# Patient Record
Sex: Female | Born: 1980 | Race: White | Hispanic: No | Marital: Married | Smoking: Never smoker
Health system: Southern US, Community
[De-identification: ages and names within clinical notes are randomized; demographics above are authoritative.]

## PROBLEM LIST (undated history)

## (undated) HISTORY — PX: CHOLECYSTECTOMY: SHX55

---

## 2004-05-23 ENCOUNTER — Inpatient Hospital Stay: Payer: Self-pay

## 2007-07-14 ENCOUNTER — Ambulatory Visit: Payer: Self-pay | Admitting: Family Medicine

## 2008-07-31 ENCOUNTER — Emergency Department: Payer: Self-pay | Admitting: Emergency Medicine

## 2008-08-04 ENCOUNTER — Observation Stay: Payer: Self-pay | Admitting: General Surgery

## 2009-02-17 ENCOUNTER — Observation Stay: Payer: Self-pay | Admitting: Obstetrics and Gynecology

## 2009-04-12 ENCOUNTER — Inpatient Hospital Stay: Payer: Self-pay | Admitting: Obstetrics and Gynecology

## 2009-05-27 IMAGING — US ABDOMEN ULTRASOUND
1 series · 17 of 25 positions shown · non-contrast
Comparison: none

REASON FOR EXAM: ruq pain
COMMENTS:

[Series 1: abdomen ultrasound · 17 of 64 slices shown]
[im 1/64]
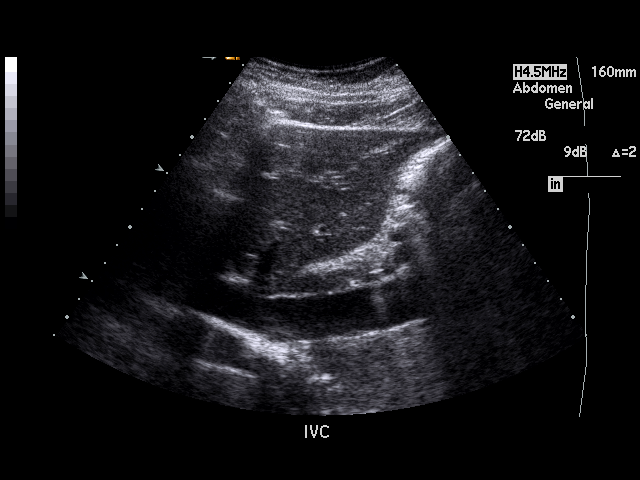
[im 6/64]
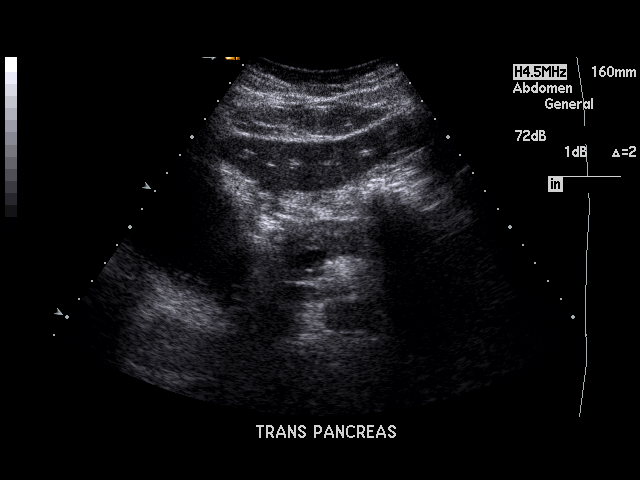
[im 8/64]
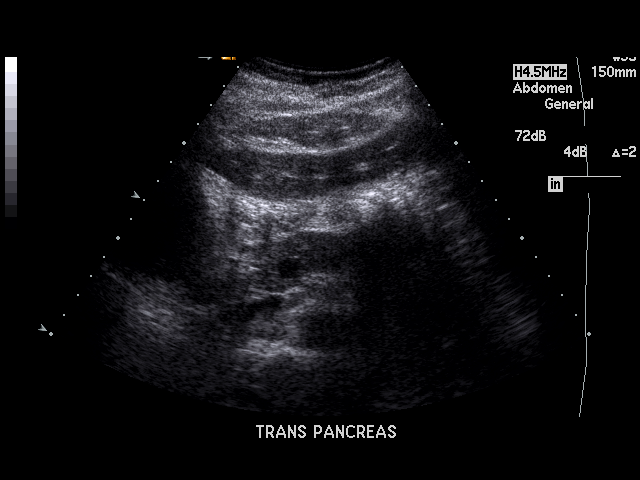
[im 14/64]
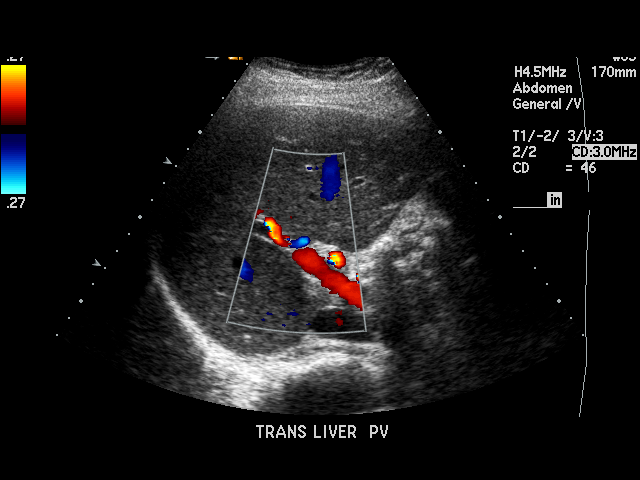
[im 16/64]
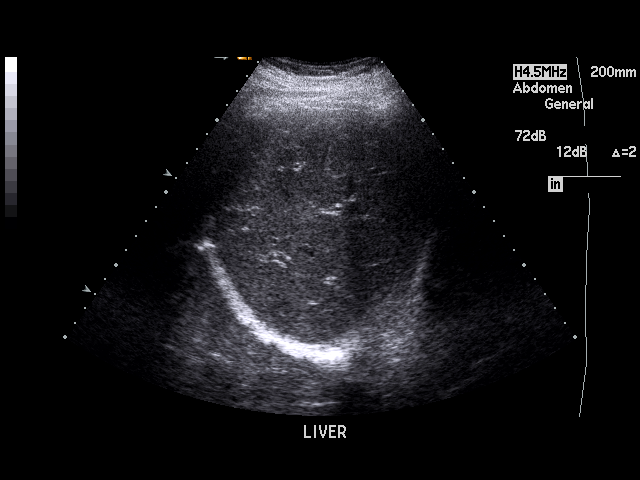
[im 22/64]
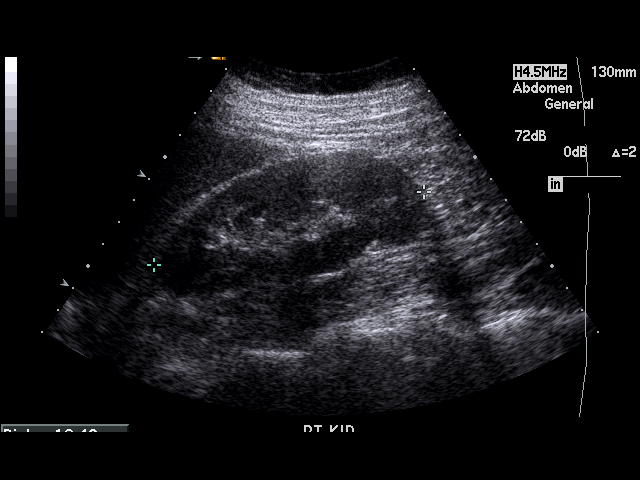
[im 24/64]
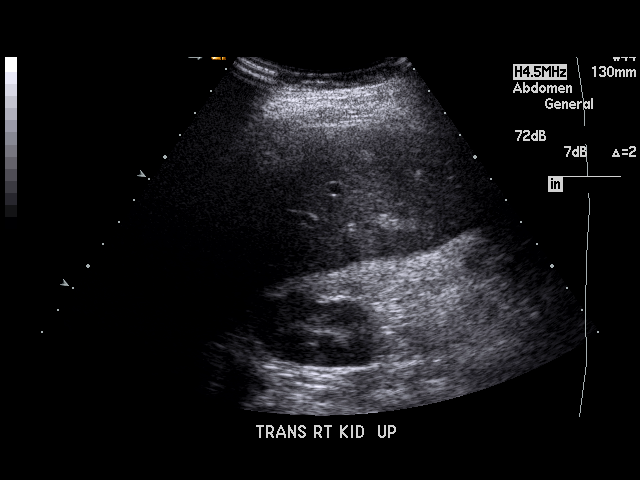
[im 29/64]
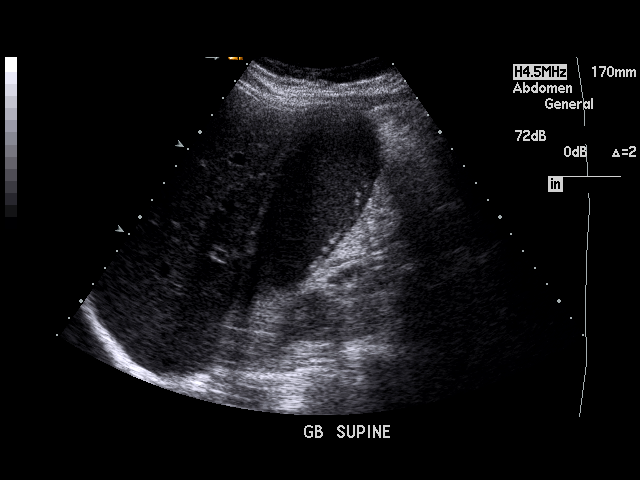
[im 32/64]
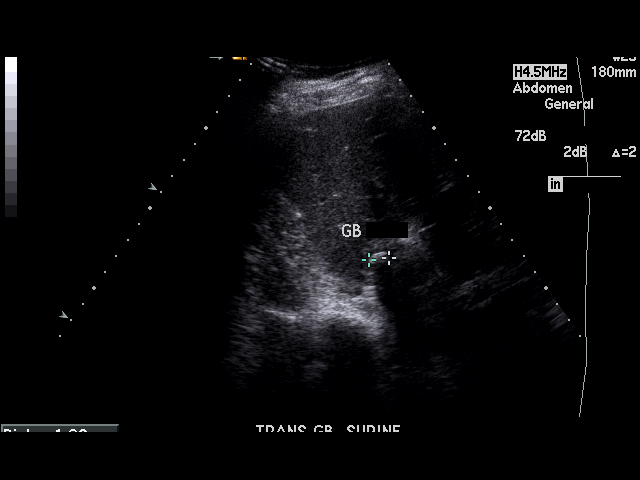
[im 35/64]
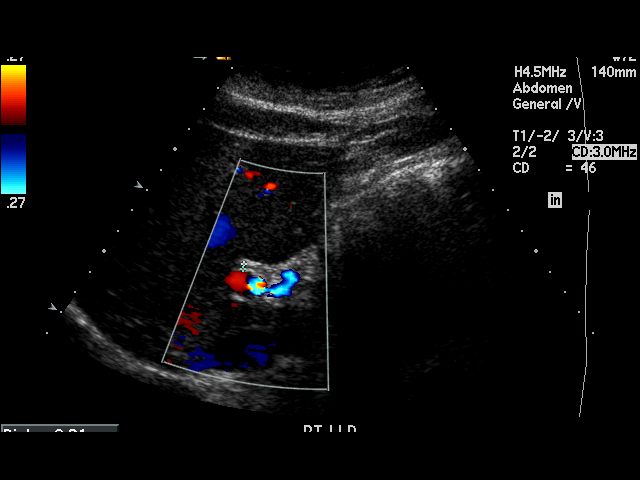
[im 40/64]
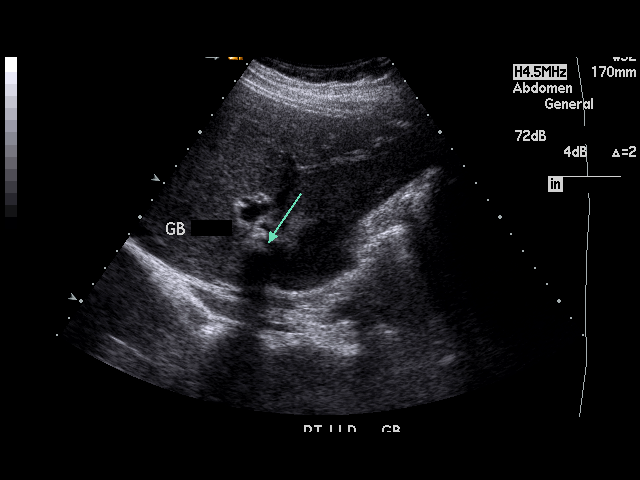
[im 43/64]
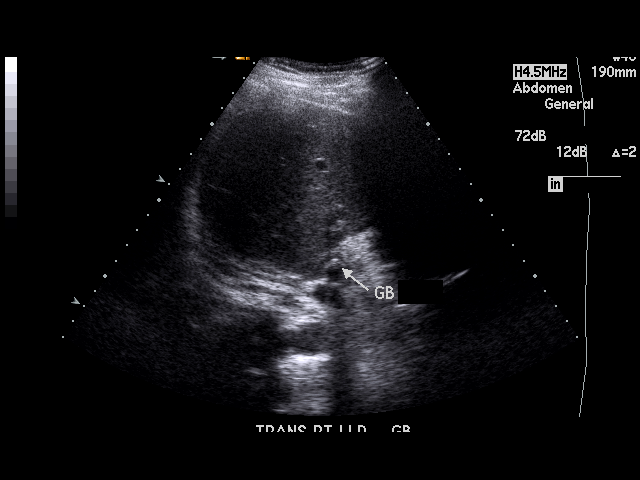
[im 48/64]
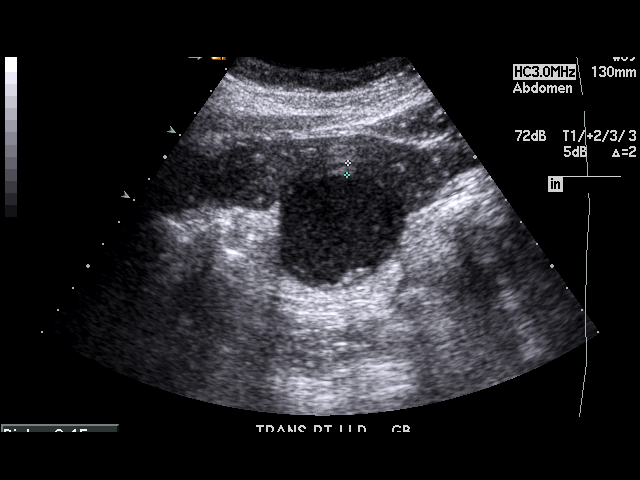
[im 50/64]
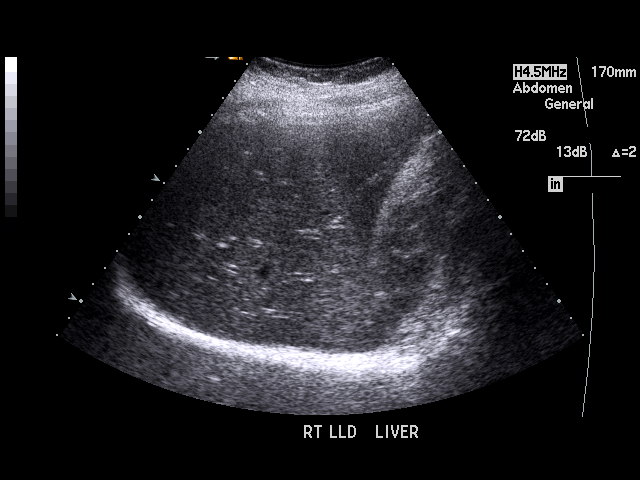
[im 56/64]
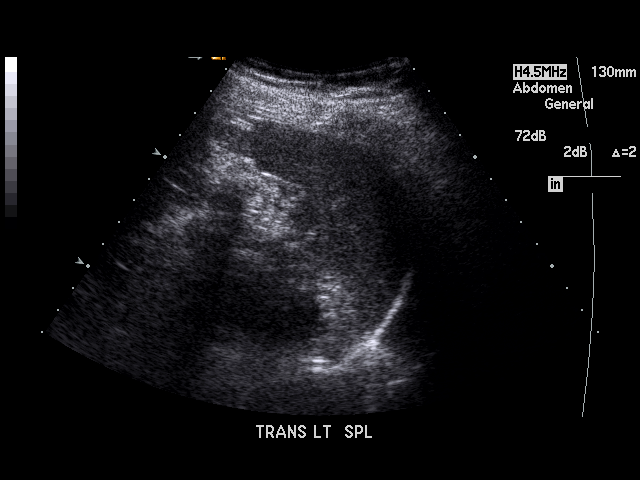
[im 58/64]
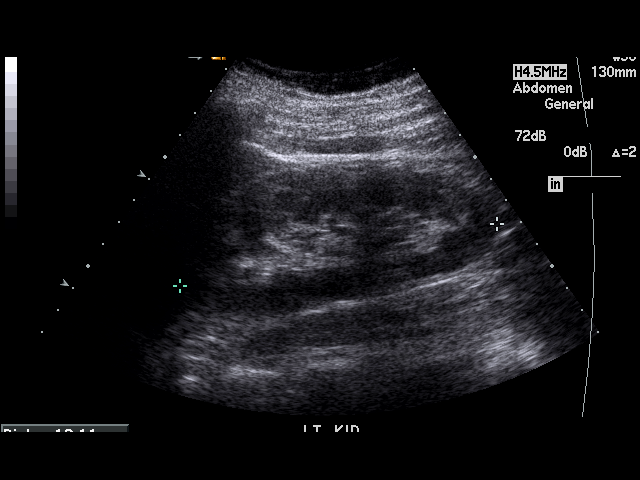
[im 64/64]
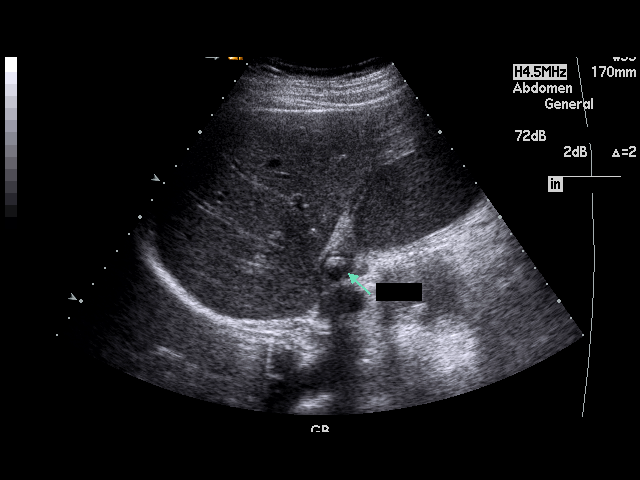

[17 of 25 positions shown; findings below may reference images not displayed]

PROCEDURE:     US  - US ABDOMEN GENERAL SURVEY  - August 04, 2008  [DATE]

RESULT:     The liver, spleen, pancreas, abdominal aorta and inferior vena
cava reveal no significant abnormalities. Note is made that the pancreatic
tail is not well seen due to adjacent bowel gas. There is sludge throughout
the gallbladder. There are multiple echodensities compatible with
gallstones. One of the stones remains in the gallbladder neck during the
course of the exam and may be lodged in the gallbladder neck. There is
thickening of the gallbladder wall which measures 4.5 mm at maximum
thickness. The gallbladder appears large and hydropic. The gallbladder
measures 11 cm x 5 cm. No pericholecystic fluid is seen. No ascites is
identified. The kidneys show no hydronephrosis. The common bile duct
measures 2.1 mm in diameter which is within normal limits.
IMPRESSION: 1.  There are gallstones and sludge within the gallbladder which appears
hydropic. There is mild thickening of the gallbladder wall suspicious for
cholecystitis.
2.  The common bile duct is normal in size.
3.  No ascites is seen.
4.  The pancreatic tail is partially obscured by bowel gas.

## 2014-05-20 ENCOUNTER — Ambulatory Visit: Payer: Self-pay | Admitting: Obstetrics and Gynecology

## 2014-07-10 NOTE — Op Note (Signed)
PATIENT NAME:  Brittany Garrett, Brittany Garrett MR#:  [id number] DATE OF BIRTH:  12/31/1980  DATE OF PROCEDURE:  05/20/2014  PREOPERATIVE DIAGNOSIS: Retained IUD.   POSTOPERATIVE DIAGNOSIS: Retained IUD.   SURGEON: Christeen DouglasBethany Suzan Manon, MD.   PROCEDURE: Hysteroscopy with IUD removal.   ANESTHESIA: Total IV anesthesia.   ESTIMATED BLOOD LOSS: Minimal. Intravenous fluids: 1 liter.   COMPLICATIONS: None.   FINDINGS: Normal size and shaped uterus, IUD in situ with strings attached and curled in the endometrial cavity.   SPECIMEN TYPE: None.   CONDITION: Stable to PACU.   INDICATION FOR PROCEDURE: Ms. Brittany Garrett is [name] with history of Mirena IUD placed 5 years ago. The IUD was attempted to be removed in the office in the standard way, as well as under ultrasound guidance; however, the strings were unable to be visualized and even under ultrasound guidance, the IUD remained in the endometrial cavity. For this reason, she was consented for Garrett hysteroscopic removal and was brought to the Operating Room for that procedure.   PROCEDURE: The patient arrived in the Operating Room, where she was identified by name and birthdate. She was placed on the operating table in supine position and IV anesthesia was given for good sedation. The patient was then prepped and draped in the usual sterile fashion and positioned in [name]. [name] was performed with all team members present and in agreement. The patient has been on doxycycline for the past 3 days after the attempted IUD removal in the office and will continue to take that postoperatively. [name] was performed with all team members present and in agreement.   Bivalve speculum was placed in the vagina and to visualize the cervix, which was grasped with [name]. Paracervical block was performed with 1% lidocaine for Garrett total of 3 mL injected. The uterus was sounded to 8 cm and  then cervix was serially dilated up with Hegar dilators. The hysteroscope was then introduced into the cervix and carefully guided to the uterine cavity. The IUD was visualized and the strings grasped with hysteroscopic graspers. The strings passed easily into the cervical canal and with very minimal traction, the IUD was removed into the vagina.   [name] was removed from the anterior lip of the cervix and hemostasis assured using silver nitrate and pressure. All instruments were removed from the vagina. [name] was taken to confirm removal. The IV sedation was reversed and the patient was taken awake to PACU. She tolerated these procedures well and is stable in recovery.   ____________________________ Cline CoolsBethany E. Keyon Winnick, MD beb:ap D: 05/20/2014 14:55:22 ET T: 05/20/2014 21:58:41 ET JOB#: [id number]  cc: Cline CoolsBethany E. Janalynn Eder, MD, <Dictator> Cline CoolsBETHANY E Rosalee Tolley MD ELECTRONICALLY SIGNED 06/14/2014 17:40

## 2015-01-07 ENCOUNTER — Encounter: Payer: Self-pay | Admitting: Emergency Medicine

## 2015-01-07 DIAGNOSIS — Z3A15 15 weeks gestation of pregnancy: Secondary | ICD-10-CM | POA: Diagnosis not present

## 2015-01-07 DIAGNOSIS — O23592 Infection of other part of genital tract in pregnancy, second trimester: Secondary | ICD-10-CM | POA: Diagnosis not present

## 2015-01-07 DIAGNOSIS — Z79899 Other long term (current) drug therapy: Secondary | ICD-10-CM | POA: Insufficient documentation

## 2015-01-07 DIAGNOSIS — O209 Hemorrhage in early pregnancy, unspecified: Secondary | ICD-10-CM | POA: Diagnosis present

## 2015-01-07 DIAGNOSIS — O200 Threatened abortion: Secondary | ICD-10-CM | POA: Diagnosis not present

## 2015-01-07 DIAGNOSIS — Z880 Allergy status to penicillin: Secondary | ICD-10-CM | POA: Diagnosis not present

## 2015-01-07 NOTE — ED Notes (Addendum)
Pt is [redacted] weeks pregnant, due 06/29/15; 5th pregnancy; 4 live births; no complications; says she was just at the movie theater, left to use the bathroom and noticed bright red bleeding; was having some discharge yesterday; [facility] and was told to monitor; now having cramping to left lower abd; denies urinary s/s

## 2015-01-08 ENCOUNTER — Emergency Department
Admission: EM | Admit: 2015-01-08 | Discharge: 2015-01-08 | Disposition: A | Discharge status: Home / Self Care | Payer: BC Managed Care – PPO | Attending: Emergency Medicine | Admitting: Emergency Medicine

## 2015-01-08 DIAGNOSIS — O200 Threatened abortion: Secondary | ICD-10-CM

## 2015-01-08 DIAGNOSIS — N760 Acute vaginitis: Secondary | ICD-10-CM

## 2015-01-08 DIAGNOSIS — B9689 Other specified bacterial agents as the cause of diseases classified elsewhere: Secondary | ICD-10-CM

## 2015-01-08 LAB — URINALYSIS COMPLETE WITH MICROSCOPIC (ARMC ONLY)
BACTERIA UA: NONE SEEN
Bilirubin Urine: NEGATIVE
GLUCOSE, UA: NEGATIVE mg/dL
Leukocytes, UA: NEGATIVE
Nitrite: NEGATIVE
PROTEIN: NEGATIVE mg/dL
SPECIFIC GRAVITY, URINE: 1.008 (ref 1.005–1.030)
pH: 6 (ref 5.0–8.0)

## 2015-01-08 LAB — CBC WITH DIFFERENTIAL/PLATELET
BASOS PCT: 0 %
Basophils Absolute: 0 10*3/uL (ref 0–0.1)
Eosinophils Absolute: 0.1 10*3/uL (ref 0–0.7)
Eosinophils Relative: 1 %
HEMATOCRIT: 38.4 % (ref 35.0–47.0)
HEMOGLOBIN: 12.5 g/dL (ref 12.0–16.0)
LYMPHS ABS: 2.6 10*3/uL (ref 1.0–3.6)
Lymphocytes Relative: 21 %
MCH: 28 pg (ref 26.0–34.0)
MCHC: 32.5 g/dL (ref 32.0–36.0)
MCV: 86.1 fL (ref 80.0–100.0)
MONO ABS: 0.8 10*3/uL (ref 0.2–0.9)
MONOS PCT: 7 %
NEUTROS ABS: 8.7 10*3/uL — AB (ref 1.4–6.5)
NEUTROS PCT: 71 %
Platelets: 258 10*3/uL (ref 150–440)
RBC: 4.46 MIL/uL (ref 3.80–5.20)
RDW: 13.4 % (ref 11.5–14.5)
WBC: 12.3 10*3/uL — ABNORMAL HIGH (ref 3.6–11.0)

## 2015-01-08 LAB — HCG, QUANTITATIVE, PREGNANCY: HCG, BETA CHAIN, QUANT, S: 57776 m[IU]/mL — AB (ref <5)

## 2015-01-08 LAB — WET PREP, GENITAL
TRICH WET PREP: NONE SEEN
YEAST WET PREP: NONE SEEN

## 2015-01-08 LAB — CHLAMYDIA/NGC RT PCR (ARMC ONLY)
Chlamydia Tr: NOT DETECTED
N gonorrhoeae: NOT DETECTED

## 2015-01-08 LAB — ABO/RH: ABO/RH(D): A POS

## 2015-01-08 MED ORDER — METRONIDAZOLE 500 MG PO TABS: 500.0000 mg | ORAL_TABLET | Freq: Two times a day (BID) | ORAL | Status: AC

## 2015-01-08 MED ORDER — METRONIDAZOLE 500 MG PO TABS
500.0000 mg | ORAL_TABLET | Freq: Once | ORAL | Status: AC
  Administered 2015-01-08: 500 mg via ORAL
  Filled 2015-01-08: qty 1

## 2015-01-08 NOTE — ED Notes (Signed)
MD at bedside for reeval

## 2015-01-08 NOTE — ED Notes (Signed)
Lab called and notified to add ABO/Rh and CBC to blood already in lab.

## 2015-01-08 NOTE — Discharge Instructions (Signed)

## 2015-01-08 NOTE — ED Notes (Signed)
MD at bedside for eval.

## 2015-01-08 NOTE — ED Provider Notes (Addendum)
[facility] Emergency Department Provider Note  ____________________________________________  Time seen: Approximately 2 AM  I have reviewed the triage vital signs and the nursing notes.   HISTORY  Chief Complaint Vaginal Bleeding    HPI Brittany Garrett is a 34 y.o. female who is a G5 P4 at 15 weeks at this time who is presenting with left lower quadrant abdominal pain as well as vaginal bleeding. She said she just finished the movie when she noticed blood on her underwear. It was several small spots of blood. When she wiped she did not have any blood. She said that she has not noticed any vaginal bleeding recently but did have some mucus-like discharge starting yesterday. She does not of any history of sexually transmitted diseases. She is followed up at the [facility] for her pregnancy. She is taking prenatal vitamins. She says that her left lower quadrant pain has been intermittent and chronic. She says it is mild at this time.   History reviewed. No pertinent past medical history.  There are no active problems to display for this patient.   Past Surgical History  Procedure Laterality Date  . Cholecystectomy      Current Outpatient Rx  Name  Route  Sig  Dispense  Refill  . Prenatal Vit-Fe Fumarate-FA (MULTIVITAMIN-PRENATAL) 27-0.8 MG TABS tablet   Oral   Take 1 tablet by mouth daily at 12 noon.           Allergies Penicillins  History reviewed. No pertinent family history.  Social History Social History  Substance Use Topics  . Smoking status: Never Smoker   . Smokeless tobacco: Never Used  . Alcohol Use: No    Review of Systems Constitutional: No fever/chills Eyes: No visual changes. ENT: No sore throat. Cardiovascular: Denies chest pain. Respiratory: Denies shortness of breath. Gastrointestinal:No diarrhea.  No constipation. Genitourinary: Negative for dysuria. Musculoskeletal: Negative for back pain. Skin: Negative for  rash. Neurological: Negative for headaches, focal weakness or numbness.  10-point ROS otherwise negative.  ____________________________________________   PHYSICAL EXAM:  VITAL SIGNS: ED Triage Vitals  Enc Vitals Group     BP 01/07/15 2333 140/74 mmHg     Pulse Rate 01/07/15 2333 97     Resp 01/07/15 2333 18     Temp 01/07/15 2333 98.1 F (36.7 C)     Temp Source 01/07/15 2333 Oral     SpO2 01/07/15 2333 100 %     Weight 01/07/15 2333 229 lb (103.874 kg)     Height 01/07/15 2333  (1.676 m)     Head Cir --      Peak Flow --      Pain Score 01/07/15 2334 3     Pain Loc --      Pain Edu? --      Excl. in GC? --     Constitutional: Alert and oriented. Well appearing and in no acute distress. Eyes: Conjunctivae are normal. PERRL. EOMI. Head: Atraumatic. Nose: No congestion/rhinnorhea. Mouth/Throat: Mucous membranes are moist.  Oropharynx non-erythematous. Neck: No stridor.   Cardiovascular: Normal rate, regular rhythm. Grossly normal heart sounds.  Good peripheral circulation. Respiratory: Normal respiratory effort.  No retractions. Lungs CTAB. Gastrointestinal: Soft with mild left lower quadrant tenderness. No distention. No abdominal bruits. No CVA tenderness. Genitourinary: Normal external appearance. No bleeding on speculum exam. No blood in the vault on speculum exam. Bimanual exam with closed cervical os. No cervical motion tenderness. There is no uterine or adnexal tenderness  or masses. Musculoskeletal: No lower extremity tenderness nor edema.  No joint effusions. Neurologic:  Normal speech and language. No gross focal neurologic deficits are appreciated. No gait instability. Skin:  Skin is warm, dry and intact. No rash noted. Psychiatric: Mood and affect are normal. Speech and behavior are normal.  ____________________________________________   LABS (all labs ordered are listed, but only abnormal results are displayed)  Labs Reviewed  WET PREP, GENITAL -  Abnormal; Notable for the following:    Clue Cells Wet Prep HPF POC FEW (*)    WBC, Wet Prep HPF POC FEW (*)    All other components within normal limits  HCG, QUANTITATIVE, PREGNANCY - Abnormal; Notable for the following:    hCG, Beta Chain, Quant, S [id number] (*)    All other components within normal limits  CBC WITH DIFFERENTIAL/PLATELET - Abnormal; Notable for the following:    WBC 12.3 (*)    Neutro Abs 8.7 (*)    All other components within normal limits  URINALYSIS COMPLETEWITH MICROSCOPIC ([facility] ONLY) - Abnormal; Notable for the following:    Color, Urine YELLOW (*)    APPearance CLEAR (*)    Ketones, ur 1+ (*)    Hgb urine dipstick 2+ (*)    Squamous Epithelial / LPF 0-5 (*)    All other components within normal limits  CHLAMYDIA/NGC RT PCR ([facility] ONLY)  ABO/RH   ____________________________________________  EKG   ____________________________________________  RADIOLOGY   ____________________________________________   PROCEDURES  ____________________________________________   INITIAL IMPRESSION / ASSESSMENT AND PLAN / ED COURSE  Pertinent labs & imaging results that were available during my care of the patient were reviewed by me and considered in my medical decision making (see chart for details).  ----------------------------------------- 4:14 AM on 01/08/2015 -----------------------------------------  Patient is resting comfortable at this time. Denying any further pain or bleeding. I did discuss her lab results with her including the clue cells seen on her wet prep. I will treat her because she is symptomatic with bleeding as well as discharge. Additionally we did discuss that she may bleed again and that this may progress to miscarriage. However, the patient is not drinking or smoking, she is taking prenatal vitamins and is following up with her clinic. She understands the plan and is willing to  comply. ____________________________________________   FINAL CLINICAL IMPRESSION(S) / ED DIAGNOSES  Acute throat miscarriage. Acute bacterial vaginosis.    Myrna Blazeravid Matthew Terease Marcotte, MD 01/08/15 [id number]  Fetal heart tones found to be in the 150s. Previous ultrasound reviewed and known IUP. Patient is a positive blood type.  Myrna Blazeravid Matthew Ladarrian Asencio, MD 01/08/15 [phone]

## 2015-03-12 NOTE — L&D Delivery Note (Addendum)
Delivery Note At 8:03 AM a viable and healthy female was delivered via Vaginal, Spontaneous Delivery (Presentation: Right Occiput Anterior, compound left arm anterior).  APGAR: 9, 9; weight 7#3oz (3260g) Placenta status: Intact, Spontaneous.  Cord: 3 vessels, with donation cord blood collected.  Cord pH: not collected  Anesthesia: Epidural  Episiotomy: None Lacerations: Vaginal Suture Repair: 3.0 vicryl rapide with a single interupted stich Est. Blood Loss (mL): 300  Mom to postpartum.  Baby to Couplet care / Skin to Skin.  IOL for 40+5wks, [id number] with GBS pos (low risk PCN allergic on ancef) RPR NR, with cervical ripening and pitocin, uncomplicated delivery.   Christeen DouglasBEASLEY, Susana Gripp 07/05/2015, 8:22 AM

## 2015-07-03 ENCOUNTER — Other Ambulatory Visit: Payer: Self-pay | Admitting: Obstetrics and Gynecology

## 2015-07-03 ENCOUNTER — Inpatient Hospital Stay
Admission: EM | Admit: 2015-07-03 | Discharge: 2015-07-07 | DRG: 767 | Disposition: A | Discharge status: Home / Self Care | Payer: BC Managed Care – PPO | Attending: Obstetrics and Gynecology | Admitting: Obstetrics and Gynecology

## 2015-07-03 DIAGNOSIS — Z3A40 40 weeks gestation of pregnancy: Secondary | ICD-10-CM | POA: Diagnosis not present

## 2015-07-03 DIAGNOSIS — Z302 Encounter for sterilization: Secondary | ICD-10-CM

## 2015-07-03 DIAGNOSIS — O99214 Obesity complicating childbirth: Secondary | ICD-10-CM | POA: Diagnosis present

## 2015-07-03 DIAGNOSIS — Z6839 Body mass index (BMI) 39.0-39.9, adult: Secondary | ICD-10-CM | POA: Diagnosis not present

## 2015-07-03 DIAGNOSIS — T360X5A Adverse effect of penicillins, initial encounter: Secondary | ICD-10-CM | POA: Diagnosis not present

## 2015-07-03 DIAGNOSIS — O480 Post-term pregnancy: Secondary | ICD-10-CM | POA: Diagnosis present

## 2015-07-03 DIAGNOSIS — L270 Generalized skin eruption due to drugs and medicaments taken internally: Secondary | ICD-10-CM | POA: Diagnosis not present

## 2015-07-03 DIAGNOSIS — Z9049 Acquired absence of other specified parts of digestive tract: Secondary | ICD-10-CM

## 2015-07-03 DIAGNOSIS — O09523 Supervision of elderly multigravida, third trimester: Secondary | ICD-10-CM | POA: Diagnosis not present

## 2015-07-03 DIAGNOSIS — O99824 Streptococcus B carrier state complicating childbirth: Secondary | ICD-10-CM | POA: Diagnosis present

## 2015-07-03 DIAGNOSIS — Y92239 Unspecified place in hospital as the place of occurrence of the external cause: Secondary | ICD-10-CM | POA: Diagnosis not present

## 2015-07-03 LAB — CBC
HCT: 37.8 % (ref 35.0–47.0)
HEMOGLOBIN: 12.8 g/dL (ref 12.0–16.0)
MCH: 29.1 pg (ref 26.0–34.0)
MCHC: 33.8 g/dL (ref 32.0–36.0)
MCV: 86.1 fL (ref 80.0–100.0)
Platelets: 290 10*3/uL (ref 150–440)
RBC: 4.39 MIL/uL (ref 3.80–5.20)
RDW: 14.2 % (ref 11.5–14.5)
WBC: 11.9 10*3/uL — ABNORMAL HIGH (ref 3.6–11.0)

## 2015-07-03 LAB — TYPE AND SCREEN
ABO/RH(D): A POS
Antibody Screen: NEGATIVE

## 2015-07-03 MED ORDER — AMMONIA AROMATIC IN INHA
RESPIRATORY_TRACT | Status: AC
  Filled 2015-07-03: qty 10

## 2015-07-03 MED ORDER — MISOPROSTOL 200 MCG PO TABS
ORAL_TABLET | ORAL | Status: AC
  Filled 2015-07-03: qty 4

## 2015-07-03 MED ORDER — OXYTOCIN 10 UNIT/ML IJ SOLN
INTRAMUSCULAR | Status: AC
  Filled 2015-07-03: qty 2

## 2015-07-03 MED ORDER — TERBUTALINE SULFATE 1 MG/ML IJ SOLN: 0.2500 mg | Freq: Once | INTRAMUSCULAR | Status: DC | PRN

## 2015-07-03 MED ORDER — ONDANSETRON HCL 4 MG/2ML IJ SOLN
4.0000 mg | Freq: Four times a day (QID) | INTRAMUSCULAR | Status: DC | PRN
  Administered 2015-07-04: 4 mg via INTRAVENOUS
  Filled 2015-07-03: qty 2

## 2015-07-03 MED ORDER — CLINDAMYCIN PHOSPHATE 900 MG/50ML IV SOLN
900.0000 mg | Freq: Three times a day (TID) | INTRAVENOUS | Status: DC
  Filled 2015-07-03: qty 50

## 2015-07-03 MED ORDER — BUTORPHANOL TARTRATE 1 MG/ML IJ SOLN
1.0000 mg | INTRAMUSCULAR | Status: DC | PRN
  Administered 2015-07-04: 1 mg via INTRAVENOUS
  Filled 2015-07-03: qty 1

## 2015-07-03 MED ORDER — DIPHENHYDRAMINE HCL 25 MG PO CAPS: 25.0000 mg | ORAL_CAPSULE | Freq: Four times a day (QID) | ORAL | Status: DC | PRN

## 2015-07-03 MED ORDER — CEFAZOLIN SODIUM 1-5 GM-% IV SOLN
1.0000 g | Freq: Three times a day (TID) | INTRAVENOUS | Status: DC
  Administered 2015-07-04 – 2015-07-05 (×2): 1 g via INTRAVENOUS
  Filled 2015-07-03 (×4): qty 50

## 2015-07-03 MED ORDER — CITRIC ACID-SODIUM CITRATE 334-500 MG/5ML PO SOLN: 30.0000 mL | ORAL | Status: DC | PRN

## 2015-07-03 MED ORDER — LACTATED RINGERS IV SOLN
500.0000 mL | INTRAVENOUS | Status: DC | PRN
  Administered 2015-07-04: 500 mL via INTRAVENOUS

## 2015-07-03 MED ORDER — DINOPROSTONE 10 MG VA INST
10.0000 mg | VAGINAL_INSERT | Freq: Once | VAGINAL | Status: AC
  Administered 2015-07-03: 10 mg via VAGINAL
  Filled 2015-07-03: qty 1

## 2015-07-03 MED ORDER — ACETAMINOPHEN 325 MG PO TABS: 650.0000 mg | ORAL_TABLET | ORAL | Status: DC | PRN

## 2015-07-03 MED ORDER — OXYTOCIN 40 UNITS IN LACTATED RINGERS INFUSION - SIMPLE MED
2.5000 [IU]/h | INTRAVENOUS | Status: DC
  Administered 2015-07-05: 39.96 [IU]/h via INTRAVENOUS
  Filled 2015-07-03 (×3): qty 1000

## 2015-07-03 MED ORDER — CEFAZOLIN SODIUM-DEXTROSE 2-4 GM/100ML-% IV SOLN
2.0000 g | Freq: Once | INTRAVENOUS | Status: AC
  Administered 2015-07-04: 2 g via INTRAVENOUS
  Filled 2015-07-03: qty 100

## 2015-07-03 MED ORDER — LIDOCAINE HCL (PF) 1 % IJ SOLN
INTRAMUSCULAR | Status: AC
  Filled 2015-07-03: qty 30

## 2015-07-03 MED ORDER — LIDOCAINE HCL (PF) 1 % IJ SOLN: 30.0000 mL | INTRAMUSCULAR | Status: DC | PRN

## 2015-07-03 MED ORDER — OXYTOCIN BOLUS FROM INFUSION: 500.0000 mL | INTRAVENOUS | Status: DC

## 2015-07-03 MED ORDER — LACTATED RINGERS IV SOLN
INTRAVENOUS | Status: DC
  Administered 2015-07-03 – 2015-07-04 (×3): via INTRAVENOUS

## 2015-07-03 NOTE — H&P (Signed)
  OB ADMISSION/ HISTORY & PHYSICAL:  Admission Date: 07/03/15 Admit Diagnosis: Elective Postdates IOL at 40+4 weeks  Brittany Garrett is a 35 y.o. female presenting for elective induction of labor for postdates at 40+4 weeks.   Prenatal History: G5P4   EDC : 06/29/15 [facility] Prenatal course complicated by AMA, Obesity, GBS Positive - PCN allergic   Prenatal Labs: ABO, Rh: --/--/A POS (10/29 2343) Antibody:  Negative Rubella:   Immune Varicella: Immune RPR:   NR HBsAg:   Negative HIV:   Negative GTT:  115 GBS:   Positive - PCN allergic   Medical / Surgical History :  Past medical history: No past medical history on file.   Past surgical history:  Past Surgical History  Procedure Laterality Date  . Cholecystectomy      Family History: No family history on file.   Social History:  reports that she has never smoked. She has never used smokeless tobacco. She reports that she does not drink alcohol or use illicit drugs.   Allergies: Penicillins    Current Medications at time of admission:  Prior to Admission medications   Medication Sig Start Date End Date Taking? Authorizing Provider  Prenatal Vit-Fe Fumarate-FA (MULTIVITAMIN-PRENATAL) 27-0.8 MG TABS tablet Take 1 tablet by mouth daily at 12 noon.    Historical Provider, MD     Review of Systems: Active FM Irregular BH ctx No LOF  / SROM  No bloody show   Physical Exam:  VS: There were no vitals taken for this visit.  General: alert and oriented, appears calm Heart: RRR Lungs: Clear lung fields Abdomen: Gravid, soft and non-tender, non-distended / uterus: gravid, non-tender Extremities: no edema  Genitalia / VE:  FTP/ 30%/ballotable/ head not engaged in pelvis, Leopold's vtx position    Assessment: 40+[redacted] weeks gestation Induction stage of labor GBS Positive - PCN allergic    Plan:  1. Ad[facility] to [facility]   - Routine labor and delivery orders  - Stadol 1mg  IVP every 1  hour PRN for pain  - May have epidural upon request 2. Induction of Labor  - Cervidil 10mg  vaginally x 1 3. GBS Positive - PCN Allergic   - Clindamycin 900mg  IV every 8 hours  3. Contraception:   - Bilateral Tubal Ligation  4. Anticipate NSVD  Dr. Feliberto GottronSchermerhorn notified of admission / plan of care  Carlean JewsMeredith Sigmon, CNM

## 2015-07-03 NOTE — Progress Notes (Signed)
Update note re: PCN allergy Pt had a mild itching response from PCN and has never had a reaction to Keflex or other cephalosporins that she is aware of. [facility] [email or web address] gives the current protocol for Clindamycin and no evidence is found that PCN sensitivities were sent to the lab therefore, Clindamycin may be resistant and therefore, Cefazolin will be given. Can order Benadryl for itching if that occurs.

## 2015-07-03 NOTE — H&P (Signed)
  OB ADMISSION/ HISTORY & PHYSICAL:  Admission Date: 07/03/15 Admit Diagnosis: Elective Postdates IOL at 40+4 weeks  Chardonnay Neta Ehlersnn Barna is a 35 y.o. female presenting for elective induction of labor for postdates at 40+4 weeks.   Prenatal History: G5P4   EDC : 06/29/15 [facility] Prenatal course complicated by AMA, Obesity, GBS Positive - PCN allergic   Prenatal Labs: ABO, Rh: --/--/A POS (04/24 2012) Antibody: NEG (04/24 2012)Negative Rubella:   Immune Varicella: Immune RPR:   NR HBsAg:   Negative HIV:   Negative GTT:  115 GBS:   Positive - PCN allergic   Medical / Surgical History :  Past medical history: History reviewed. No pertinent past medical history.   Past surgical history:  Past Surgical History  Procedure Laterality Date  . Cholecystectomy      Family History:  Family History  Problem Relation Age of Onset  . Crohn's disease Mother      Social History:  reports that she has never smoked. She has never used smokeless tobacco. She reports that she does not drink alcohol or use illicit drugs.   Allergies: Penicillins    Current Medications at time of admission:  Prior to Admission medications   Medication Sig Start Date End Date Taking? Authorizing Provider  Prenatal Vit-Fe Fumarate-FA (MULTIVITAMIN-PRENATAL) 27-0.8 MG TABS tablet Take 1 tablet by mouth daily at 12 noon.    Historical Provider, MD     Review of Systems: Active FM Irregular BH ctx No LOF  / SROM  No bloody show   Physical Exam:  VS: Blood pressure 115/72, pulse 91, temperature 98.6 F (37 C), temperature source Oral, resp. rate 18, last menstrual period 09/22/2014.  General: alert and oriented, appears calm Heart: RRR Lungs: Clear lung fields Abdomen: Gravid, soft and non-tender, non-distended / uterus: gravid, non-tender Extremities: no edema  Genitalia / VE:  FTP/ 30%/ballotable/ head not engaged in pelvis, Leopold's vtx position     Assessment: 40+[redacted] weeks gestation Induction stage of labor GBS Positive - PCN allergic    Plan:  1. Admit to [facility]   - Routine labor and delivery orders  - Stadol 1mg  IVP every 1 hour PRN for pain  - May have epidural upon request 2. Induction of Labor  - Cervidil 10mg  vaginally x 1 3. GBS Positive - PCN Allergic   - Clindamycin 900mg  IV every 8 hours  3. Contraception:   - Bilateral Tubal Ligation  4. Anticipate NSVD  Dr. Feliberto GottronSchermerhorn notified of admission / plan of care  Carlean JewsMeredith Sigmon, CNM

## 2015-07-04 ENCOUNTER — Encounter: Payer: Self-pay | Admitting: *Deleted

## 2015-07-04 ENCOUNTER — Inpatient Hospital Stay: Payer: BC Managed Care – PPO | Admitting: Anesthesiology

## 2015-07-04 ENCOUNTER — Other Ambulatory Visit: Payer: Self-pay | Admitting: Obstetrics and Gynecology

## 2015-07-04 LAB — CBC
HCT: 35.5 % (ref 35.0–47.0)
HEMOGLOBIN: 11.9 g/dL — AB (ref 12.0–16.0)
MCH: 29.3 pg (ref 26.0–34.0)
MCHC: 33.6 g/dL (ref 32.0–36.0)
MCV: 87.1 fL (ref 80.0–100.0)
Platelets: 267 10*3/uL (ref 150–440)
RBC: 4.08 MIL/uL (ref 3.80–5.20)
RDW: 14.4 % (ref 11.5–14.5)
WBC: 12.4 10*3/uL — AB (ref 3.6–11.0)

## 2015-07-04 MED ORDER — FENTANYL 2.5 MCG/ML W/ROPIVACAINE 0.2% IN NS 100 ML EPIDURAL INFUSION (ARMC-ANES): 10.0000 mL/h | EPIDURAL | Status: DC

## 2015-07-04 MED ORDER — OXYTOCIN 40 UNITS IN LACTATED RINGERS INFUSION - SIMPLE MED
1.0000 m[IU]/min | INTRAVENOUS | Status: DC
  Administered 2015-07-04 (×2): 1 m[IU]/min via INTRAVENOUS

## 2015-07-04 MED ORDER — SODIUM CHLORIDE 0.9 % IJ SOLN
INTRAMUSCULAR | Status: AC
  Filled 2015-07-04: qty 50

## 2015-07-04 MED ORDER — FENTANYL CITRATE (PF) 250 MCG/5ML IJ SOLN
INTRAMUSCULAR | Status: DC
  Administered 2015-07-05: 07:00:00 via INTRAVENOUS
  Filled 2015-07-04 (×2): qty 100

## 2015-07-04 MED ORDER — LIDOCAINE-EPINEPHRINE (PF) 1.5 %-1:200000 IJ SOLN
INTRAMUSCULAR | Status: DC | PRN
  Administered 2015-07-04: 3 mg via PERINEURAL

## 2015-07-04 MED ORDER — ROPIVACAINE HCL 2 MG/ML IJ SOLN
INTRAMUSCULAR | Status: DC | PRN
  Administered 2015-07-04: 10 mL/h via EPIDURAL

## 2015-07-04 MED ORDER — ROPIVACAINE HCL 2 MG/ML IJ SOLN
10.0000 mL/h | INTRAMUSCULAR | Status: DC
Start: 2015-07-04 — End: 2015-07-04
  Filled 2015-07-04: qty 200

## 2015-07-04 NOTE — Progress Notes (Signed)
Brittany Garrett is a 35 y.o. [id number] at 8727w5d IOL for term pregnancy  Subjective: Patient comfortable with epidural.  Objective: BP 102/61 mmHg  Pulse 89  Temp(Src) 98.4 F (36.9 C) (Oral)  Resp 18  Ht 5\' 6"  (1.676 m)  Wt 110.2 kg (242 lb 15.2 oz)  BMI 39.23 kg/m2  SpO2 97%  LMP 09/22/2014 I/O last 3 completed shifts: In: 1370.8 [I.V.:1370.8] Out: -     FHT:  FHR: 120 bpm, variability: moderate,  accelerations:  Present,  decelerations:  Absent UC:   regular, every 3-5 minutes SVE:   Dilation: 3.5 Effacement (%): 50 Station: Ballotable Exam by:: BB  Labs: Lab Results  Component Value Date   WBC 12.4* 07/04/2015   HGB 11.9* 07/04/2015   HCT 35.5 07/04/2015   MCV 87.1 07/04/2015   PLT 267 07/04/2015    Assessment / Plan: Induction of labor due to elective - with one episode of Cat II strip with decel x696min to the 90s, relieved with position changes,  on pitocin. She has been here for 24hrs, with prostaglandin cervical ripening overnight. Small Foley balloon placed and came out this morning. On pitocin throughout the day.  However, no cervical change since admission. The baby is positioned appropriately, cephalic with back to maternal left by bedside ultrasound by me this morning. EFW by Leopold's 9#, with a proven pelvis to 9#.  Her pitocin was held after fetal decel and now back at 13. Now that she is comfortable with her epidural, pitocin titration to contractions q2-3 min is the plan.  I have just placed a Cook Foley balloon with 50ml of water in each bulb. Her cervix is stretchy to 3-4 cm, but still very posterior and high. Good fetal movement and fetal sutures palpated during placement. Fundal pressure used.  Plan for AROM as soon as is safe, as carefully as possible. Prior to her epidural, I considered interupting induction and rescheduling for 5 days from now, in hopes she would labor spontaneously, but after fetal decel I am uncomfortable sending her home.  Therefore, we will continue to progress.  Her FHT is reassuring at this time. The patient and family's questions were answered. She is emotionally stable and handling this process well.  Labor: Progressing on Pitocin, will continue to increase then AROM Fetal Wellbeing:  Category I Pain Control:  Epidural I/D:  Ancef for GBS ppx Anticipated MOD:  NSVD  Urania Pearlman 07/04/2015, 10:55 PM

## 2015-07-04 NOTE — Anesthesia Procedure Notes (Signed)
Epidural Patient location during procedure: OB Start time: 07/04/2015 8:52 PM End time: 07/04/2015 9:14 PM  Staffing Performed by: anesthesiologist   Preanesthetic Checklist Completed: patient identified, site marked, surgical consent, pre-op evaluation, timeout performed, IV checked, risks and benefits discussed and monitors and equipment checked  Epidural Patient position: sitting Prep: Betadine Patient monitoring: heart rate, continuous pulse ox and blood pressure Approach: midline Location: L4-L5 Injection technique: LOR saline  Needle:  Needle type: Tuohy  Needle gauge: 17 G Needle length: 9 cm and 9 Needle insertion depth: 7 cm Catheter type: closed end flexible Catheter size: 19 Gauge Test dose: negative and 1.5% lidocaine with Epi 1:200 K  Assessment Events: blood not aspirated, injection not painful, no injection resistance, negative IV test and no paresthesia  Additional Notes   Patient tolerated the insertion well without complications.Reason for block:procedure for pain

## 2015-07-04 NOTE — Progress Notes (Signed)
Brittany Garrett is a 35 y.o. G5P4000 at 5562w5d IOL for term pregnancy  Subjective: feeling ctx 7/10, will desire epidural when active  Objective: BP 107/71 mmHg  Pulse 73  Temp(Src) 98.3 F (36.8 C) (Oral)  Resp 20  Ht 5\' 6"  (1.676 m)  Wt 110.2 kg (242 lb 15.2 oz)  BMI 39.23 kg/m2  LMP 09/22/2014   Total I/O In: 1370.8 [I.V.:1370.8] Out: -   FHT:  FHR: 140s bpm, variability: moderate,  accelerations:  Present,  decelerations:  Absent UC:   regular, every 3 minutes SVE:   Dilation: 1.5 Effacement (%): 40 Station: Ballotable Exam by:: JMG  Labs: Lab Results  Component Value Date   WBC 11.9* 07/03/2015   HGB 12.8 07/03/2015   HCT 37.8 07/03/2015   MCV 86.1 07/03/2015   PLT 290 07/03/2015    Assessment / Plan: Induction of labor due to term with favorable cervix,  progressing well on pitocin  Labor: Progressing normally Fetal Wellbeing:  Category I Pain Control:  Labor support without medications I/D:  Ancef for GBS ppx as no high risk sx with PCN Anticipated MOD:  NSVD   Foley bulb placed in cervix, which was checked after FB came out and is now 3/40/ballottable.  Bedside U/S confirmed fetal viability  Brittany Garrett 07/04/2015, 1:43 PM

## 2015-07-04 NOTE — Anesthesia Preprocedure Evaluation (Signed)
Anesthesia Evaluation  Patient identified by MRN, date of birth, ID band Patient awake    Reviewed: Allergy & Precautions, Patient's Chart, lab work & pertinent test results  History of Anesthesia Complications Negative for: history of anesthetic complications  Airway Mallampati: II       Dental   Pulmonary neg pulmonary ROS,           Cardiovascular negative cardio ROS       Neuro/Psych negative neurological ROS  negative psych ROS   GI/Hepatic Neg liver ROS, GERD  Medicated and Poorly Controlled,  Endo/Other  negative endocrine ROS  Renal/GU negative Renal ROS  negative genitourinary   Musculoskeletal negative musculoskeletal ROS (+)   Abdominal   Peds negative pediatric ROS (+)  Hematology negative hematology ROS (+)   Anesthesia Other Findings   Reproductive/Obstetrics (+) Pregnancy                             Anesthesia Physical Anesthesia Plan  ASA: II  Anesthesia Plan: Epidural   Post-op Pain Management:    Induction:   Airway Management Planned:   Additional Equipment:   Intra-op Plan:   Post-operative Plan:   Informed Consent: I have reviewed the patients History and Physical, chart, labs and discussed the procedure including the risks, benefits and alternatives for the proposed anesthesia with the patient or authorized representative who has indicated his/her understanding and acceptance.     Plan Discussed with:   Anesthesia Plan Comments:         Anesthesia Quick Evaluation

## 2015-07-05 ENCOUNTER — Encounter: Payer: Self-pay | Admitting: *Deleted

## 2015-07-05 LAB — RPR: RPR: NONREACTIVE

## 2015-07-05 MED ORDER — OXYCODONE-ACETAMINOPHEN 5-325 MG PO TABS
1.0000 | ORAL_TABLET | Freq: Four times a day (QID) | ORAL | Status: DC | PRN
  Administered 2015-07-05: 2 via ORAL
  Administered 2015-07-06 – 2015-07-07 (×3): 1 via ORAL
  Filled 2015-07-05: qty 2
  Filled 2015-07-05 (×3): qty 1

## 2015-07-05 MED ORDER — PHENYLEPHRINE 40 MCG/ML (10ML) SYRINGE FOR IV PUSH (FOR BLOOD PRESSURE SUPPORT)
80.0000 ug | PREFILLED_SYRINGE | INTRAVENOUS | Status: DC | PRN
  Filled 2015-07-05: qty 5

## 2015-07-05 MED ORDER — LACTATED RINGERS IV SOLN
INTRAVENOUS | Status: DC
Start: 2015-07-05 — End: 2015-07-07
  Administered 2015-07-06 (×2): via INTRAVENOUS

## 2015-07-05 MED ORDER — BENZOCAINE-MENTHOL 20-0.5 % EX AERO: 1.0000 "application " | INHALATION_SPRAY | CUTANEOUS | Status: DC | PRN

## 2015-07-05 MED ORDER — DIBUCAINE 1 % RE OINT: 1.0000 "application " | TOPICAL_OINTMENT | RECTAL | Status: DC | PRN

## 2015-07-05 MED ORDER — SIMETHICONE 80 MG PO CHEW: 80.0000 mg | CHEWABLE_TABLET | ORAL | Status: DC | PRN

## 2015-07-05 MED ORDER — ONDANSETRON HCL 4 MG/2ML IJ SOLN
4.0000 mg | INTRAMUSCULAR | Status: DC | PRN
  Administered 2015-07-06: 4 mg via INTRAVENOUS

## 2015-07-05 MED ORDER — PRENATAL PLUS 27-1 MG PO TABS: 1.0000 | ORAL_TABLET | Freq: Every day | ORAL | Status: DC

## 2015-07-05 MED ORDER — LACTATED RINGERS IV SOLN: 500.0000 mL | Freq: Once | INTRAVENOUS | Status: DC

## 2015-07-05 MED ORDER — OXYTOCIN 10 UNIT/ML IJ SOLN
INTRAMUSCULAR | Status: AC
  Filled 2015-07-05: qty 2

## 2015-07-05 MED ORDER — FLEET ENEMA 7-19 GM/118ML RE ENEM: 1.0000 | ENEMA | Freq: Every day | RECTAL | Status: DC | PRN

## 2015-07-05 MED ORDER — LIDOCAINE HCL (PF) 1 % IJ SOLN
INTRAMUSCULAR | Status: AC
  Filled 2015-07-05: qty 30

## 2015-07-05 MED ORDER — AMMONIA AROMATIC IN INHA
RESPIRATORY_TRACT | Status: AC
  Filled 2015-07-05: qty 10

## 2015-07-05 MED ORDER — WITCH HAZEL-GLYCERIN EX PADS: 1.0000 "application " | MEDICATED_PAD | CUTANEOUS | Status: DC | PRN

## 2015-07-05 MED ORDER — METOCLOPRAMIDE HCL 10 MG PO TABS
10.0000 mg | ORAL_TABLET | Freq: Once | ORAL | Status: DC
  Filled 2015-07-05: qty 1

## 2015-07-05 MED ORDER — SODIUM CHLORIDE 0.9% FLUSH: 3.0000 mL | INTRAVENOUS | Status: DC | PRN

## 2015-07-05 MED ORDER — FAMOTIDINE 20 MG PO TABS
40.0000 mg | ORAL_TABLET | Freq: Once | ORAL | Status: DC
  Filled 2015-07-05: qty 2

## 2015-07-05 MED ORDER — ONDANSETRON HCL 4 MG PO TABS: 4.0000 mg | ORAL_TABLET | ORAL | Status: DC | PRN

## 2015-07-05 MED ORDER — MEASLES, MUMPS & RUBELLA VAC ~~LOC~~ INJ
0.5000 mL | INJECTION | Freq: Once | SUBCUTANEOUS | Status: DC
  Filled 2015-07-05: qty 0.5

## 2015-07-05 MED ORDER — MISOPROSTOL 200 MCG PO TABS
ORAL_TABLET | ORAL | Status: AC
  Filled 2015-07-05: qty 4

## 2015-07-05 MED ORDER — EPHEDRINE 5 MG/ML INJ
10.0000 mg | INTRAVENOUS | Status: DC | PRN
  Filled 2015-07-05: qty 2

## 2015-07-05 MED ORDER — DIPHENHYDRAMINE HCL 25 MG PO CAPS
25.0000 mg | ORAL_CAPSULE | Freq: Four times a day (QID) | ORAL | Status: DC | PRN
Start: 2015-07-05 — End: 2015-07-07

## 2015-07-05 MED ORDER — ACETAMINOPHEN 325 MG PO TABS: 650.0000 mg | ORAL_TABLET | ORAL | Status: DC | PRN

## 2015-07-05 MED ORDER — SODIUM CHLORIDE 0.9 % IV SOLN: 250.0000 mL | INTRAVENOUS | Status: DC | PRN

## 2015-07-05 MED ORDER — SODIUM CHLORIDE FLUSH 0.9 % IV SOLN
INTRAVENOUS | Status: AC
  Filled 2015-07-05: qty 10

## 2015-07-05 MED ORDER — BISACODYL 10 MG RE SUPP: 10.0000 mg | Freq: Every day | RECTAL | Status: DC | PRN

## 2015-07-05 MED ORDER — DIPHENHYDRAMINE HCL 50 MG/ML IJ SOLN: 12.5000 mg | INTRAMUSCULAR | Status: DC | PRN

## 2015-07-05 MED ORDER — ZOLPIDEM TARTRATE 5 MG PO TABS: 5.0000 mg | ORAL_TABLET | Freq: Every evening | ORAL | Status: DC | PRN

## 2015-07-05 MED ORDER — COCONUT OIL OIL: 1.0000 "application " | TOPICAL_OIL | Status: DC | PRN

## 2015-07-05 MED ORDER — IBUPROFEN 600 MG PO TABS
600.0000 mg | ORAL_TABLET | Freq: Four times a day (QID) | ORAL | Status: DC
  Administered 2015-07-05 – 2015-07-06 (×3): 600 mg via ORAL
  Filled 2015-07-05 (×3): qty 1

## 2015-07-05 MED ORDER — SENNOSIDES-DOCUSATE SODIUM 8.6-50 MG PO TABS
2.0000 | ORAL_TABLET | ORAL | Status: DC
  Administered 2015-07-06 (×2): 2 via ORAL
  Filled 2015-07-05 (×2): qty 2

## 2015-07-05 MED ORDER — TETANUS-DIPHTH-ACELL PERTUSSIS 5-2.5-18.5 LF-MCG/0.5 IM SUSP: 0.5000 mL | Freq: Once | INTRAMUSCULAR | Status: DC

## 2015-07-05 MED ORDER — SODIUM CHLORIDE 0.9% FLUSH: 3.0000 mL | Freq: Two times a day (BID) | INTRAVENOUS | Status: DC

## 2015-07-05 MED FILL — Fentanyl Citrate Preservative Free (PF) Inj 500 MCG/10ML: EPIDURAL | Qty: 100 | Status: AC

## 2015-07-05 NOTE — Progress Notes (Signed)
Patient ID: Brittany Garrett, female   DOB: 01/20/1981, 35 y.o.   MRN: [id number]  Fully dilated at +1 station, Cat I strip, will begin pushing with the patient.

## 2015-07-05 NOTE — Progress Notes (Signed)
RN to the bedside to assist pt. To BR for post delivery void.  Pt. Unable to void, pericare given, assisted ambulation to BR. Pt. [facility]; via WC. Nursery RN at the bedside to transfer baby via bassinet.

## 2015-07-05 NOTE — Discharge Summary (Signed)
Obstetric Discharge Summary   Patient ID: Brittany Garrett MRN: [id number] DOB/AGE: 37/04/1980 37 y.o.   Date of Admission: 07/03/2015  Date of Discharge: 07/05/15  Admitting Diagnosis: Induction of labor at 3466w6d  Secondary Diagnosis: none  Mode of Delivery: normal spontaneous vaginal delivery     Discharge Diagnosis: NSVD   Intrapartum Procedures: epidural   Post partum procedures: BTL   Complications: none   Brief Hospital Course  Brittany Garrett is a [id number] who had a SVD on 07/03/15;  for further details of this delivery, please refer to the delivery note.  Patient had an uncomplicated postpartum course.  By time of discharge on PPD#2, her pain was controlled on oral pain medications; she had appropriate lochia and was ambulating, voiding without difficulty and tolerating regular diet.  She was deemed stable for discharge to home.        Labs: CBC Latest Ref Rng 07/04/2015 07/03/2015 01/07/2015  WBC 3.6 - 11.0 K/uL 12.4(H) 11.9(H) 12.3(H)  Hemoglobin 12.0 - 16.0 g/dL 11.9(L) 12.8 12.5  Hematocrit 35.0 - 47.0 % 35.5 37.8 38.4  Platelets 150 - 440 K/uL 267 290 258   A POS  Physical exam:  Blood pressure 108/63, pulse 97, temperature 98.7 F (37.1 C), temperature source Oral, resp. rate 18, height 5\' 6"  (1.676 m), weight 242 lb 15.2 oz (110.2 kg), last menstrual period 09/22/2014, SpO2 95 %, unknown if currently breastfeeding. General: alert and no distress Lungs CTA bilat, no W/R/R. Heart: S1S2, RRR, no M/R/G Lochia: appropriate Abdomen: soft, NT Uterine Fundus: firm  dehiscence, no significant erythema Extremities: No evidence of DVT seen on physical exam. No lower extremity edema.  Discharge Instructions: Per After Visit Summary. Activity: Advance as tolerated. Pelvic rest for 6 weeks.  Also refer to After Visit Summary Diet: Regular Medications:   Medication List    ASK your doctor about these medications        multivitamin-prenatal 27-0.8 MG Tabs  tablet  Take 1 tablet by mouth daily at 12 noon.       Outpatient follow up:  Postpartum contraception: BTL  Discharged Condition: good  Discharged to: home   Newborn Data:  Baby Boy  Disposition:home with mother  Apgars: APGAR (1 MIN): 9   APGAR (5 MINS): 9   APGAR (10 MINS):    Baby Feeding: Breast  Sharee Pimplearon W Jones, CNM 07/05/2015

## 2015-07-06 ENCOUNTER — Encounter: Payer: Self-pay | Admitting: *Deleted

## 2015-07-06 ENCOUNTER — Inpatient Hospital Stay: Payer: BC Managed Care – PPO | Admitting: Anesthesiology

## 2015-07-06 ENCOUNTER — Encounter
Admission: EM | Disposition: A | Discharge status: Home / Self Care | Payer: Self-pay | Source: Home / Self Care | Attending: Obstetrics and Gynecology

## 2015-07-06 HISTORY — PX: TUBAL LIGATION: SHX77

## 2015-07-06 LAB — CBC
HCT: 34.6 % — ABNORMAL LOW (ref 35.0–47.0)
Hemoglobin: 11.3 g/dL — ABNORMAL LOW (ref 12.0–16.0)
MCH: 29.2 pg (ref 26.0–34.0)
MCHC: 32.8 g/dL (ref 32.0–36.0)
MCV: 89.2 fL (ref 80.0–100.0)
PLATELETS: 221 10*3/uL (ref 150–440)
RBC: 3.87 MIL/uL (ref 3.80–5.20)
RDW: 14.4 % (ref 11.5–14.5)
WBC: 13 10*3/uL — ABNORMAL HIGH (ref 3.6–11.0)

## 2015-07-06 SURGERY — LIGATION, FALLOPIAN TUBE, POSTPARTUM
Anesthesia: General | Wound class: Clean Contaminated

## 2015-07-06 MED ORDER — DEXAMETHASONE SODIUM PHOSPHATE 10 MG/ML IJ SOLN
INTRAMUSCULAR | Status: DC | PRN
  Administered 2015-07-06: 10 mg via INTRAVENOUS

## 2015-07-06 MED ORDER — ONDANSETRON HCL 4 MG/2ML IJ SOLN: 4.0000 mg | Freq: Once | INTRAMUSCULAR | Status: DC | PRN

## 2015-07-06 MED ORDER — HYDROMORPHONE HCL 1 MG/ML IJ SOLN
INTRAMUSCULAR | Status: AC
  Administered 2015-07-06: 0.25 mg via INTRAVENOUS
  Filled 2015-07-06: qty 1

## 2015-07-06 MED ORDER — SUGAMMADEX SODIUM 200 MG/2ML IV SOLN
INTRAVENOUS | Status: DC | PRN
  Administered 2015-07-06: 220.4 mg via INTRAVENOUS

## 2015-07-06 MED ORDER — HYDROMORPHONE HCL 1 MG/ML IJ SOLN
0.2500 mg | INTRAMUSCULAR | Status: DC | PRN
  Administered 2015-07-06: 0.25 mg via INTRAVENOUS

## 2015-07-06 MED ORDER — BUPIVACAINE HCL 0.5 % IJ SOLN
INTRAMUSCULAR | Status: DC | PRN
  Administered 2015-07-06: 7 mL
  Administered 2015-07-06: 14 mL

## 2015-07-06 MED ORDER — FENTANYL CITRATE (PF) 100 MCG/2ML IJ SOLN
25.0000 ug | INTRAMUSCULAR | Status: DC | PRN
  Administered 2015-07-06: 25 ug via INTRAVENOUS

## 2015-07-06 MED ORDER — FENTANYL CITRATE (PF) 100 MCG/2ML IJ SOLN
25.0000 ug | INTRAMUSCULAR | Status: DC | PRN
  Administered 2015-07-06 (×3): 25 ug via INTRAVENOUS

## 2015-07-06 MED ORDER — ROCURONIUM BROMIDE 100 MG/10ML IV SOLN
INTRAVENOUS | Status: DC | PRN
  Administered 2015-07-06: 40 mg via INTRAVENOUS
  Administered 2015-07-06: 10 mg via INTRAVENOUS

## 2015-07-06 MED ORDER — PROPOFOL 10 MG/ML IV BOLUS
INTRAVENOUS | Status: DC | PRN
  Administered 2015-07-06: 150 mg via INTRAVENOUS

## 2015-07-06 MED ORDER — FENTANYL CITRATE (PF) 100 MCG/2ML IJ SOLN
INTRAMUSCULAR | Status: AC
  Administered 2015-07-06: 25 ug via INTRAVENOUS
  Filled 2015-07-06: qty 2

## 2015-07-06 MED ORDER — SUCCINYLCHOLINE CHLORIDE 20 MG/ML IJ SOLN
INTRAMUSCULAR | Status: DC | PRN
  Administered 2015-07-06: 120 mg via INTRAVENOUS

## 2015-07-06 MED ORDER — FENTANYL CITRATE (PF) 100 MCG/2ML IJ SOLN
INTRAMUSCULAR | Status: DC | PRN
  Administered 2015-07-06: 50 ug via INTRAVENOUS
  Administered 2015-07-06: 100 ug via INTRAVENOUS

## 2015-07-06 MED ORDER — MIDAZOLAM HCL 2 MG/2ML IJ SOLN
INTRAMUSCULAR | Status: DC | PRN
  Administered 2015-07-06: 2 mg via INTRAVENOUS

## 2015-07-06 MED ORDER — LIDOCAINE HCL (CARDIAC) 20 MG/ML IV SOLN
INTRAVENOUS | Status: DC | PRN
Start: 2015-07-06 — End: 2015-07-06
  Administered 2015-07-06: 100 mg via INTRAVENOUS

## 2015-07-06 MED ORDER — IBUPROFEN 600 MG PO TABS
600.0000 mg | ORAL_TABLET | Freq: Four times a day (QID) | ORAL | Status: DC
  Administered 2015-07-07 (×2): 600 mg via ORAL
  Filled 2015-07-06 (×2): qty 1

## 2015-07-06 SURGICAL SUPPLY — 39 items
APPLICATOR COTTON TIP 6IN STRL (MISCELLANEOUS) IMPLANT
BLADE CLIPPER SURG (BLADE) ×3 IMPLANT
BLADE SURG SZ11 CARB STEEL (BLADE) ×3 IMPLANT
CANISTER SUCT 1200ML W/VALVE (MISCELLANEOUS) ×3 IMPLANT
CHLORAPREP W/TINT 26ML (MISCELLANEOUS) ×3 IMPLANT
CLOSURE WOUND 1/4X4 (GAUZE/BANDAGES/DRESSINGS) ×1
DRAPE LAPAROTOMY 100X77 ABD (DRAPES) ×3 IMPLANT
DRESSING TELFA 4X3 1S ST N-ADH (GAUZE/BANDAGES/DRESSINGS) IMPLANT
DRSG TEGADERM 4X4.75 (GAUZE/BANDAGES/DRESSINGS) ×3 IMPLANT
ELECT REM PT RETURN 9FT ADLT (ELECTROSURGICAL) ×3
ELECTRODE REM PT RTRN 9FT ADLT (ELECTROSURGICAL) ×1 IMPLANT
GAUZE SPONGE NON-WVN 2X2 STRL (MISCELLANEOUS) IMPLANT
GLOVE BIO SURGEON STRL SZ 6.5 (GLOVE) ×8 IMPLANT
GLOVE BIO SURGEONS STRL SZ 6.5 (GLOVE) ×4
GLOVE INDICATOR 7.0 STRL GRN (GLOVE) ×3 IMPLANT
GOWN STRL REUS W/ TWL LRG LVL3 (GOWN DISPOSABLE) ×2 IMPLANT
GOWN STRL REUS W/TWL LRG LVL3 (GOWN DISPOSABLE) ×4
KIT RM TURNOVER CYSTO AR (KITS) ×3 IMPLANT
LABEL OR SOLS (LABEL) ×3 IMPLANT
LIGASURE BLUNT 5MM 37CM (INSTRUMENTS) ×3 IMPLANT
LIGASURE IMPACT 36 18CM CVD LR (INSTRUMENTS) IMPLANT
NDL SAFETY 22GX1.5 (NEEDLE) ×3 IMPLANT
NS IRRIG 500ML POUR BTL (IV SOLUTION) ×3 IMPLANT
PACK BASIN MINOR ARMC (MISCELLANEOUS) ×3 IMPLANT
PAD OB MATERNITY 4.3X12.25 (PERSONAL CARE ITEMS) IMPLANT
RETRACTOR WOUND ALXS 18CM SML (MISCELLANEOUS) ×1 IMPLANT
RTRCTR WOUND ALEXIS O 18CM SML (MISCELLANEOUS) ×3
SPONGE LAP 4X18 5PK (MISCELLANEOUS) IMPLANT
SPONGE VERSALON 2X2 STRL (MISCELLANEOUS)
STRIP CLOSURE SKIN 1/4X4 (GAUZE/BANDAGES/DRESSINGS) ×2 IMPLANT
SUT CHROMIC GUT BROWN 0 54 (SUTURE) ×1 IMPLANT
SUT CHROMIC GUT BROWN 0 54IN (SUTURE) ×3
SUT MNCRL 4-0 (SUTURE) ×2
SUT MNCRL 4-0 27XMFL (SUTURE) ×1
SUT VIC AB 0 CT2 27 (SUTURE) ×3 IMPLANT
SUT VICRYL 0 AB UR-6 (SUTURE) ×6 IMPLANT
SUTURE MNCRL 4-0 27XMF (SUTURE) ×1 IMPLANT
SWABSTK COMLB BENZOIN TINCTURE (MISCELLANEOUS) ×3 IMPLANT
SYRINGE 10CC LL (SYRINGE) ×3 IMPLANT

## 2015-07-06 NOTE — Op Note (Signed)
Brittany Garrett 07/03/2015 - 07/06/2015  PREOPERATIVE DIAGNOSES: Multiparity, undesired fertility  POSTOPERATIVE DIAGNOSES: Multiparity, undesired fertility  PROCEDURE:  Postpartum Bilateral Tubal Sterilization by bilateral salpingectomy  SURGEON: Dr. Christeen DouglasBethany Carel Schnee  ANESTHESIA:  GETA and local analgesia using 20 ml of 0.5% Marcaine  Anesthesiologist: Berdine AddisonMathai Thomas, MD Anesthesiologist: Berdine AddisonMathai Thomas, MD CRNA: Junious SilkMark Noles, CRNA  COMPLICATIONS:  None immediate.  ESTIMATED BLOOD LOSS: minimal.  FLUIDS: 1000 ml LR.  URINE OUTPUT:  Not assessed  INDICATIONS:  35 y.o. [id number] with undesired fertility,status post vaginal delivery, desires permanent sterilization.  Other reversible forms of contraception were discussed with patient; she declines all other modalities. Risks of procedure discussed with patient including but not limited to: risk of regret, permanence of method, bleeding, infection, injury to surrounding organs and need for additional procedures.  Failure risk of 1 -2 % with increased risk of ectopic gestation if pregnancy occurs was also discussed with patient.      FINDINGS:  Normal uterus, tubes, and ovaries.  PROCEDURE DETAILS:  The patient was taken to the operating room where her epidural anesthesia was dosed up to surgical level and found to be adequate. She was then placed in the dorsal supine position and prepped and draped in sterile fashion. After an adequate timeout was performed, attention was turned to the patient's abdomen where a small transverse skin incision was made under the umbilical fold. The incision was taken down to the layer of fascia using the scalpel, and fascia was incised, and extended bilaterally using Mayo scissors. The peritoneum was entered in a sharp fashion. Attention was then turned to the patient's uterus, and left fallopian tube was identified and followed out to the fimbriated end. Using a Ligasure bipolar cautery device, the left tube was  cauterized and sharply excised. A similar process was carried out on the right side allowing for bilateral tubal sterilization. Good hemostasis was noted overall. Local analgesia was poured over both adenexa.The instruments were then removed from the patient's abdomen and the fascial incision was repaired with 0 Vicryl, and the skin was closed with a 4-0 Vicryl subcuticular stitch. The patient tolerated the procedure well. Instrument, sponge, and needle counts were correct times two. The patient was then taken to the recovery room awake and in stable condition.

## 2015-07-06 NOTE — Anesthesia Postprocedure Evaluation (Signed)
Anesthesia Post Note  Patient: Brittany BlakesCandy Ann Garrett  Procedure(s) Performed: * No procedures listed *  Patient location during evaluation: Mother Baby Anesthesia Type: Epidural Level of consciousness: awake and alert and oriented Pain management: satisfactory to patient Vital Signs Assessment: post-procedure vital signs reviewed and stable Respiratory status: respiratory function stable Cardiovascular status: stable Postop Assessment: no backache, no headache, epidural receding, patient able to bend at knees, no signs of nausea or vomiting and adequate PO intake Anesthetic complications: no    Last Vitals:  Filed Vitals:   07/06/15 0019 07/06/15 0427  BP: 99/53 115/59  Pulse: 71 69  Temp: 36.8 C 36.5 C  Resp: 16 20    Last Pain:  Filed Vitals:   07/06/15 0428  PainSc: 6                  Clydene PughBeane, Ermin Parisien D

## 2015-07-06 NOTE — Transfer of Care (Signed)
Immediate Anesthesia Transfer of Care Note  Patient: Brittany Garrett  Procedure(s) Performed: Procedure(s): POST PARTUM TUBAL LIGATION (N/A)  Patient Location: PACU  Anesthesia Type:General  Level of Consciousness: sedated  Airway & Oxygen Therapy: Patient Spontanous Breathing and Patient connected to face mask oxygen  Post-op Assessment: Report given to RN and Post -op Vital signs reviewed and stable  Post vital signs: Reviewed and stable  Last Vitals:  Filed Vitals:   07/06/15 0746 07/06/15 1111  BP: 91/58 102/76  Pulse: 72 78  Temp: 36.8 C 36.1 C  Resp: 18 16    Last Pain:  Filed Vitals:   07/06/15 1114  PainSc: 6          Complications: No apparent anesthesia complications

## 2015-07-06 NOTE — Anesthesia Post-op Follow-up Note (Signed)
  Anesthesia Pain Follow-up Note  Patient: Brittany Garrett  Day #: 1  Date of Follow-up: 07/06/2015 Time: 7:18 AM  Last Vitals:  Filed Vitals:   07/06/15 0019 07/06/15 0427  BP: 99/53 115/59  Pulse: 71 69  Temp: 36.8 C 36.5 C  Resp: 16 20    Level of Consciousness: alert  Pain: mild   Side Effects:None  Catheter Site Exam: site not evaluated  Plan: D/C from anesthesia care  Clydene PughBeane, Jareli Highland D

## 2015-07-06 NOTE — Anesthesia Procedure Notes (Signed)
Procedure Name: Intubation Date/Time: 07/06/2015 1:45 PM Performed by: Junious SilkNOLES, Brittany Amey Pre-anesthesia Checklist: Patient identified, Patient being monitored, Timeout performed, Emergency Drugs available and Suction available Patient Re-evaluated:Patient Re-evaluated prior to inductionOxygen Delivery Method: Circle system utilized Preoxygenation: Pre-oxygenation with 100% oxygen Intubation Type: IV induction Ventilation: Mask ventilation without difficulty Laryngoscope Size: Mac and 3 Grade View: Grade I Tube type: Oral Tube size: 7.0 mm Number of attempts: 1 Airway Equipment and Method: Stylet Placement Confirmation: ETT inserted through vocal cords under direct vision,  positive ETCO2 and breath sounds checked- equal and bilateral Secured at: 21 cm Tube secured with: Tape Dental Injury: Teeth and Oropharynx as per pre-operative assessment

## 2015-07-06 NOTE — Anesthesia Postprocedure Evaluation (Signed)
Anesthesia Post Note  Patient: Phylliss BlakesCandy Ann Drumheller  Procedure(s) Performed: Procedure(s) (LRB): POST PARTUM TUBAL LIGATION (N/A)  Patient location during evaluation: PACU Anesthesia Type: General Level of consciousness: awake and alert Pain management: pain level controlled Vital Signs Assessment: post-procedure vital signs reviewed and stable Respiratory status: spontaneous breathing, nonlabored ventilation, respiratory function stable and patient connected to nasal cannula oxygen Cardiovascular status: blood pressure returned to baseline and stable Postop Assessment: no signs of nausea or vomiting Anesthetic complications: no    Last Vitals:  Filed Vitals:   07/06/15 1507 07/06/15 1511  BP:  119/75  Pulse: 83 68  Temp: 36.2 C   Resp: 13 14    Last Pain:  Filed Vitals:   07/06/15 1511  PainSc: 1                  Tywan Siever S

## 2015-07-06 NOTE — Anesthesia Preprocedure Evaluation (Signed)
Anesthesia Evaluation  Patient identified by MRN, date of birth, ID band Patient awake    Reviewed: Allergy & Precautions, NPO status , Patient's Chart, lab work & pertinent test results, reviewed documented beta blocker date and time   Airway Mallampati: III  TM Distance: >3 FB     Dental  (+) Chipped   Pulmonary           Cardiovascular      Neuro/Psych    GI/Hepatic   Endo/Other    Renal/GU      Musculoskeletal   Abdominal   Peds  Hematology   Anesthesia Other Findings Obesity.  Reproductive/Obstetrics                             Anesthesia Physical Anesthesia Plan  ASA: III  Anesthesia Plan: General   Post-op Pain Management:    Induction: Intravenous  Airway Management Planned: Oral ETT  Additional Equipment:   Intra-op Plan:   Post-operative Plan:   Informed Consent: I have reviewed the patients History and Physical, chart, labs and discussed the procedure including the risks, benefits and alternatives for the proposed anesthesia with the patient or authorized representative who has indicated his/her understanding and acceptance.     Plan Discussed with: CRNA  Anesthesia Plan Comments:         Anesthesia Quick Evaluation

## 2015-07-07 LAB — CBC
HCT: 32.9 % — ABNORMAL LOW (ref 35.0–47.0)
Hemoglobin: 10.9 g/dL — ABNORMAL LOW (ref 12.0–16.0)
MCH: 29.2 pg (ref 26.0–34.0)
MCHC: 32.9 g/dL (ref 32.0–36.0)
MCV: 88.7 fL (ref 80.0–100.0)
PLATELETS: 227 10*3/uL (ref 150–440)
RBC: 3.72 MIL/uL — ABNORMAL LOW (ref 3.80–5.20)
RDW: 14.5 % (ref 11.5–14.5)
WBC: 12.4 10*3/uL — AB (ref 3.6–11.0)

## 2015-07-07 NOTE — Progress Notes (Signed)
Patient understands all discharge instructions and the need to make follow up appointments. Patient discharge via wheelchair with auxillary. 

## 2015-07-07 NOTE — Discharge Instructions (Signed)
Postpartum Care After Vaginal Delivery °After you deliver your newborn (postpartum period), the usual stay in the hospital is 24-72 hours. If there were problems with your labor or delivery, or if you have other medical problems, you might be in the hospital longer.  °While you are in the hospital, you will receive help and instructions on how to care for yourself and your newborn during the postpartum period.  °While you are in the hospital: °· Be sure to tell your nurses if you have pain or discomfort, as well as where you feel the pain and what makes the pain worse. °· If you had an incision made near your vagina (episiotomy) or if you had some tearing during delivery, the nurses may put ice packs on your episiotomy or tear. The ice packs may help to reduce the pain and swelling. °· If you are breastfeeding, you may feel uncomfortable contractions of your uterus for a couple of weeks. This is normal. The contractions help your uterus get back to normal size. °· It is normal to have some bleeding after delivery. °¨ For the first 1-3 days after delivery, the flow is red and the amount may be similar to a period. °¨ It is common for the flow to start and stop. °¨ In the first few days, you may pass some small clots. Let your nurses know if you begin to pass large clots or your flow increases. °¨ Do not  flush blood clots down the toilet before having the nurse look at them. °¨ During the next 3-10 days after delivery, your flow should become more watery and pink or brown-tinged in color. °¨ Ten to fourteen days after delivery, your flow should be a small amount of yellowish-white discharge. °¨ The amount of your flow will decrease over the first few weeks after delivery. Your flow may stop in 6-8 weeks. Most women have had their flow stop by 12 weeks after delivery. °· You should change your sanitary pads frequently. °· Wash your hands thoroughly with soap and water for at least 20 seconds after changing pads, using  the toilet, or before holding or feeding your newborn. °· You should feel like you need to empty your bladder within the first 6-8 hours after delivery. °· In case you become weak, lightheaded, or faint, call your nurse before you get out of bed for the first time and before you take a shower for the first time. °· Within the first few days after delivery, your breasts may begin to feel tender and full. This is called engorgement. Breast tenderness usually goes away within 48-72 hours after engorgement occurs. You may also notice milk leaking from your breasts. If you are not breastfeeding, do not stimulate your breasts. Breast stimulation can make your breasts produce more milk. °· Spending as much time as possible with your newborn is very important. During this time, you and your newborn can feel close and get to know each other. Having your newborn stay in your room (rooming in) will help to strengthen the bond with your newborn.  It will give you time to get to know your newborn and become comfortable caring for your newborn. °· Your hormones change after delivery. Sometimes the hormone changes can temporarily cause you to feel sad or tearful. These feelings should not last more than a few days. If these feelings last longer than that, you should talk to your caregiver. °· If desired, talk to your caregiver about methods of family planning or contraception. °·   Talk to your caregiver about immunizations. Your caregiver may want you to have the following immunizations before leaving the hospital:  Tetanus, diphtheria, and pertussis (Tdap) or tetanus and diphtheria (Td) immunization. It is very important that you and your family (including grandparents) or others caring for your newborn are up-to-date with the Tdap or Td immunizations. The Tdap or Td immunization can help protect your newborn from getting ill.  Rubella immunization.  Varicella (chickenpox) immunization.  Influenza immunization. You should  receive this annual immunization if you did not receive the immunization during your pregnancy.   This information is not intended to replace advice given to you by your health care provider. Make sure you discuss any questions you have with your health care provider.   Document Released: 12/23/2006 Document Revised: 11/20/2011 Document Reviewed: 10/23/2011 Elsevier Interactive Patient Education 2016 [employer]  Incision Care: Keep incision area clean, dry and open to air. Shower daily to prevent infection. Only pat incisions, no rubbing or circling the incision area. Use mild soap and warm water to clean incisions. Make sure to dry area completely.  Monitor incision area for redness, severe pain, drainage, or odor, if so contact your physician.  No heavy lifting until cleared by physician.  Take pain medications to manage pain, if pain is increased or unrelieved by medications contact your physician.   Make sure to stay active, ambulate often.  Call your physician if you develop a temperature greater than 100.4, experience any chest pain, shortness of breath.  Make sure to follow up with physician within specified time.  Bleeding: Your bleeding could continue up to 6 weeks, the flow should gradually decrease and the color should become dark then lightened over the next couple of weeks. If you notice you are bleeding heavily or passing clots larger than the size of your fist, PLEASE call your physician. No TAMPONS, DOUCHING, ENEMAS OR SEXUAL INTERCOURSE for 6 weeks.   Stitches: Shower daily with mild soap and water. Stitches will dissolve over the next couple of weeks, if you experience any discomfort in the vaginal area you may sit in warm water 15-20 minutes, 3-4 times per day. Just enough water to cover vaginal area.   AfterPains: This is the uterus contracting back to its normal position and size. Use medications prescribed or recommended by your physician to help relieve this  discomfort.   Bowels/Hemorrhoids: Drink plenty of water and stay active. Increase fiber, fresh fruits and vegetables in your diet.   Rest/Activity: Rest when the baby is resting  Bathing: Shower daily!  Diet: Continue to eat extra calories until your follow up visit to help replenish nutrients and vitamins. If breastfeeding eat an extra [phone] and increase your fluid intake to 12 glasses a day.   Contraception: Consult with your physician on what method of birth control you would like to use.   Postpartum "BLUES": It is common to emotional days after delivery, however if it persist for greater than 2 weeks or if you feel concerned please let your physician know immediately. This is hormone driven and nothing you can control so please let someone know how you feel.  Follow Up Visit: Please schedule a follow up visit with your physician.

## 2015-07-10 LAB — SURGICAL PATHOLOGY
